# Patient Record
Sex: Female | Born: 1970 | Race: White | Hispanic: No | Marital: Single | Smoking: Current some day smoker
Health system: Southern US, Community
[De-identification: ages and names within clinical notes are randomized; demographics above are authoritative.]

---

## 2018-12-20 ENCOUNTER — Encounter: Payer: Self-pay | Admitting: Sports Medicine

## 2018-12-20 ENCOUNTER — Ambulatory Visit (INDEPENDENT_AMBULATORY_CARE_PROVIDER_SITE_OTHER): Payer: Self-pay | Admitting: Sports Medicine

## 2018-12-20 ENCOUNTER — Other Ambulatory Visit: Payer: Self-pay | Admitting: Sports Medicine

## 2018-12-20 ENCOUNTER — Other Ambulatory Visit: Payer: Self-pay

## 2018-12-20 VITALS — Temp 98.9°F | Resp 16

## 2018-12-20 DIAGNOSIS — M722 Plantar fascial fibromatosis: Secondary | ICD-10-CM

## 2018-12-20 DIAGNOSIS — S46919A Strain of unspecified muscle, fascia and tendon at shoulder and upper arm level, unspecified arm, initial encounter: Secondary | ICD-10-CM | POA: Insufficient documentation

## 2018-12-20 DIAGNOSIS — K029 Dental caries, unspecified: Secondary | ICD-10-CM | POA: Insufficient documentation

## 2018-12-20 DIAGNOSIS — M79672 Pain in left foot: Secondary | ICD-10-CM

## 2018-12-20 DIAGNOSIS — K047 Periapical abscess without sinus: Secondary | ICD-10-CM | POA: Insufficient documentation

## 2018-12-20 MED ORDER — PREDNISONE 10 MG (21) PO TBPK: ORAL_TABLET | ORAL | 0 refills | Status: AC

## 2018-12-20 MED ORDER — TRIAMCINOLONE ACETONIDE 10 MG/ML IJ SUSP
10.0000 mg | Freq: Once | INTRAMUSCULAR | Status: AC
  Administered 2018-12-20: 22:00:00 10 mg

## 2018-12-20 NOTE — Patient Instructions (Signed)
Plantar Fasciitis  Plantar fasciitis is a painful foot condition that affects the heel. It occurs when the band of tissue that connects the toes to the heel bone (plantar fascia) becomes irritated. This can happen as the result of exercising too much or doing other repetitive activities (overuse injury). The pain from plantar fasciitis can range from mild irritation to severe pain that makes it difficult to walk or move. The pain is usually worse in the morning after sleeping, or after sitting or lying down for a while. Pain may also be worse after long periods of walking or standing. What are the causes? This condition may be caused by:  Standing for long periods of time.  Wearing shoes that do not have good arch support.  Doing activities that put stress on joints (high-impact activities), including running, aerobics, and ballet.  Being overweight.  An abnormal way of walking (gait).  Tight muscles in the back of your lower leg (calf).  High arches in your feet.  Starting a new athletic activity. What are the signs or symptoms? The main symptom of this condition is heel pain. Pain may:  Be worse with first steps after a time of rest, especially in the morning after sleeping or after you have been sitting or lying down for a while.  Be worse after long periods of standing still.  Decrease after 30-45 minutes of activity, such as gentle walking. How is this diagnosed? This condition may be diagnosed based on your medical history and your symptoms. Your health care provider may ask questions about your activity level. Your health care provider will do a physical exam to check for:  A tender area on the bottom of your foot.  A high arch in your foot.  Pain when you move your foot.  Difficulty moving your foot. You may have imaging tests to confirm the diagnosis, such as:  X-rays.  Ultrasound.  MRI. How is this treated? Treatment for plantar fasciitis depends on how  severe your condition is. Treatment may include:  Rest, ice, applying pressure (compression), and raising the affected foot (elevation). This may be called RICE therapy. Your health care provider may recommend RICE therapy along with over-the-counter pain medicines to manage your pain.  Exercises to stretch your calves and your plantar fascia.  A splint that holds your foot in a stretched, upward position while you sleep (night splint).  Physical therapy to relieve symptoms and prevent problems in the future.  Injections of steroid medicine (cortisone) to relieve pain and inflammation.  Stimulating your plantar fascia with electrical impulses (extracorporeal shock wave therapy). This is usually the last treatment option before surgery.  Surgery, if other treatments have not worked after 12 months. Follow these instructions at home:  Managing pain, stiffness, and swelling  If directed, put ice on the painful area: ? Put ice in a plastic bag, or use a frozen bottle of water. ? Place a towel between your skin and the bag or bottle. ? Roll the bottom of your foot over the bag or bottle. ? Do this for 20 minutes, 2-3 times a day.  Wear athletic shoes that have air-sole or gel-sole cushions, or try wearing soft shoe inserts that are designed for plantar fasciitis.  Raise (elevate) your foot above the level of your heart while you are sitting or lying down. Activity  Avoid activities that cause pain. Ask your health care provider what activities are safe for you.  Do physical therapy exercises and stretches as told   by your health care provider.  Try activities and forms of exercise that are easier on your joints (low-impact). Examples include swimming, water aerobics, and biking. General instructions  Take over-the-counter and prescription medicines only as told by your health care provider.  Wear a night splint while sleeping, if told by your health care provider. Loosen the splint  if your toes tingle, become numb, or turn cold and blue.  Maintain a healthy weight, or work with your health care provider to lose weight as needed.  Keep all follow-up visits as told by your health care provider. This is important. Contact a health care provider if you:  Have symptoms that do not go away after caring for yourself at home.  Have pain that gets worse.  Have pain that affects your ability to move or do your daily activities. Summary  Plantar fasciitis is a painful foot condition that affects the heel. It occurs when the band of tissue that connects the toes to the heel bone (plantar fascia) becomes irritated.  The main symptom of this condition is heel pain that may be worse after exercising too much or standing still for a long time.  Treatment varies, but it usually starts with rest, ice, compression, and elevation (RICE therapy) and over-the-counter medicines to manage pain. This information is not intended to replace advice given to you by your health care provider. Make sure you discuss any questions you have with your health care provider. Document Released: 01/03/2001 Document Revised: 03/23/2017 Document Reviewed: 02/05/2017 [employer] Patient Education  2020 [employer]  

## 2018-12-20 NOTE — Progress Notes (Signed)
Subjective: Tina Murphy is a 48 y.o. female patient presents to office with complaint of moderate heel pain on the left. Patient admits to post static dyskinesia for 1 week in duration, 9/10pain that is constant after working 12 hours feel like her arch is trying to stretch open, Admits swelling but denies opening, drainage, redness, or warmth. Patient has treated this problem with diclofenac as given by her PCP but could not take it because it made her stomach hurt. Denies any other pedal complaints.   Review of Systems  Musculoskeletal: Positive for joint pain and myalgias.  All other systems reviewed and are negative.    Patient Active Problem List   Diagnosis Date Noted  . Dental abscess 12/20/2018  . Dental caries 12/20/2018  . Plantar fasciitis of left foot 12/20/2018  . Shoulder strain 12/20/2018    No current outpatient medications on file prior to visit.   No current facility-administered medications on file prior to visit.     Not on File  Objective: Physical Exam General: The patient is alert and oriented x3 in no acute distress.  Dermatology: Skin is warm, dry and supple bilateral lower extremities. Nails 1-10 are normal. There is no erythema, edema, no eccymosis, no open lesions present. Integument is otherwise unremarkable.  Vascular: Dorsalis Pedis pulse and Posterior Tibial pulse are 2/4 bilateral. Capillary fill time is immediate to all digits.  Neurological: Grossly intact to light touch with an achilles reflex of +2/5 and a  negative Tinel's sign bilateral.  Musculoskeletal: Tenderness to palpation at the medial calcaneal tubercale and through the insertion of the plantar fascia on the left foot. No pain with compression of calcaneus bilateral. No pain with tuning fork to calcaneus bilateral. No pain with calf compression bilateral. There is decreased Ankle joint range of motion bilateral. All other joints range of motion within normal limits bilateral.  Strength 5/5 in all groups bilateral.   Gait: Unassisted, Antalgic avoid weight on left heel  Xray, Left foot:  Normal osseous mineralization. Joint spaces preserved. No fracture/dislocation/boney destruction. Calcaneal spur present with mild thickening of plantar fascia. No other soft tissue abnormalities or radiopaque foreign bodies.   Assessment and Plan: Problem List Items Addressed This Visit      Musculoskeletal and Integument   Plantar fasciitis of left foot - Primary    Other Visit Diagnoses    Inflammatory pain of left heel         -Complete examination performed.  -Xrays reviewed -Discussed with patient in detail the condition of plantar fasciitis, how this occurs and general treatment options. Explained both conservative and surgical treatments.  -After oral consent and aseptic prep, injected a mixture containing 1 ml of 2%  plain lidocaine, 1 ml 0.5% plain marcaine, 0.5 ml of kenalog 10 and 0.5 ml of dexamethasone phosphate into left heel. Post-injection care discussed with patient.  -Dispensed heel lift to use as instructed -Explained and dispensed to patient daily stretching exercises. -Recommend patient to ice affected area 1-2x daily. -Patient to return to office as needed or sooner if problems or questions arise.  Landis Martins, DPM

## 2021-05-23 ENCOUNTER — Other Ambulatory Visit: Payer: Self-pay | Admitting: Family Medicine

## 2021-05-23 DIAGNOSIS — M2740 Unspecified cyst of jaw: Secondary | ICD-10-CM

## 2021-06-10 ENCOUNTER — Other Ambulatory Visit: Payer: Self-pay

## 2021-06-17 ENCOUNTER — Other Ambulatory Visit: Payer: PRIVATE HEALTH INSURANCE

## 2021-08-02 ENCOUNTER — Other Ambulatory Visit: Payer: PRIVATE HEALTH INSURANCE

## 2021-08-05 ENCOUNTER — Ambulatory Visit (INDEPENDENT_AMBULATORY_CARE_PROVIDER_SITE_OTHER): Payer: 59

## 2021-08-05 DIAGNOSIS — M2740 Unspecified cyst of jaw: Secondary | ICD-10-CM

## 2021-08-15 ENCOUNTER — Other Ambulatory Visit: Payer: Self-pay | Admitting: Family Medicine

## 2021-08-15 DIAGNOSIS — Z1231 Encounter for screening mammogram for malignant neoplasm of breast: Secondary | ICD-10-CM

## 2021-12-06 ENCOUNTER — Other Ambulatory Visit: Payer: Self-pay | Admitting: Unknown Physician Specialty

## 2021-12-06 DIAGNOSIS — K118 Other diseases of salivary glands: Secondary | ICD-10-CM

## 2021-12-08 NOTE — Progress Notes (Signed)
Patient on schedule for non sedated Parotid biopsy, called and spoke with patient on phone. Made aware to be here @ 1230, and may drive home post procedure. Stated understanding.

## 2021-12-14 ENCOUNTER — Ambulatory Visit
Admission: RE | Admit: 2021-12-14 | Discharge: 2021-12-14 | Disposition: A | Discharge status: Home / Self Care | Payer: Commercial Managed Care - HMO | Source: Ambulatory Visit | Attending: Unknown Physician Specialty | Admitting: Unknown Physician Specialty

## 2021-12-14 DIAGNOSIS — K118 Other diseases of salivary glands: Secondary | ICD-10-CM

## 2021-12-14 DIAGNOSIS — D110 Benign neoplasm of parotid gland: Secondary | ICD-10-CM | POA: Diagnosis not present

## 2021-12-15 LAB — SURGICAL PATHOLOGY

## 2021-12-23 ENCOUNTER — Other Ambulatory Visit: Payer: Self-pay | Admitting: Unknown Physician Specialty

## 2021-12-23 DIAGNOSIS — D37030 Neoplasm of uncertain behavior of the parotid salivary glands: Secondary | ICD-10-CM

## 2022-01-05 ENCOUNTER — Inpatient Hospital Stay: Admission: RE | Admit: 2022-01-05 | Payer: 59 | Source: Ambulatory Visit

## 2022-01-17 ENCOUNTER — Inpatient Hospital Stay: Admission: RE | Admit: 2022-01-17 | Payer: 59 | Source: Ambulatory Visit

## 2022-02-02 ENCOUNTER — Inpatient Hospital Stay: Admission: RE | Admit: 2022-02-02 | Payer: 59 | Source: Ambulatory Visit

## 2022-02-09 ENCOUNTER — Ambulatory Visit
Admission: RE | Admit: 2022-02-09 | Discharge: 2022-02-09 | Disposition: A | Discharge status: Home / Self Care | Payer: Commercial Managed Care - HMO | Source: Ambulatory Visit | Attending: Unknown Physician Specialty | Admitting: Unknown Physician Specialty

## 2022-02-09 DIAGNOSIS — D37030 Neoplasm of uncertain behavior of the parotid salivary glands: Secondary | ICD-10-CM

## 2022-02-09 MED ORDER — IOPAMIDOL (ISOVUE-300) INJECTION 61%
75.0000 mL | Freq: Once | INTRAVENOUS | Status: AC | PRN
  Administered 2022-02-09: 75 mL via INTRAVENOUS

## 2023-07-25 IMAGING — CT CT MAXILLOFACIAL W/O CM
3 series · 15 of 47 positions shown, 18 images · non-contrast
Comparison: None.

CLINICAL DATA: Cyst like area anterior to the left ear.



[Series 2: max soft · axial · 0.38mm/px · z∈[-186,-72]mm · 9 of 67 slices shown, 12 images]
[im 5/67  brain]
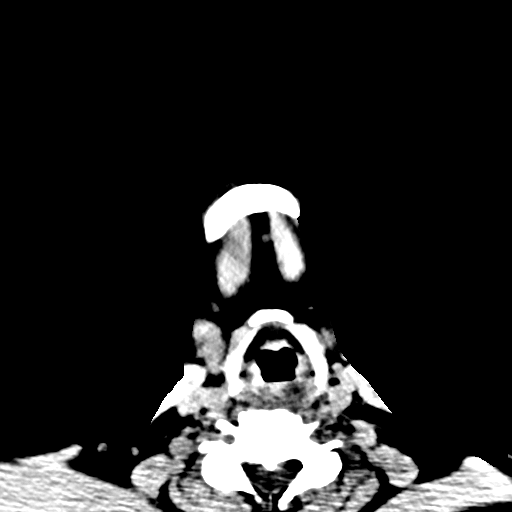
[im 5/67  bone]
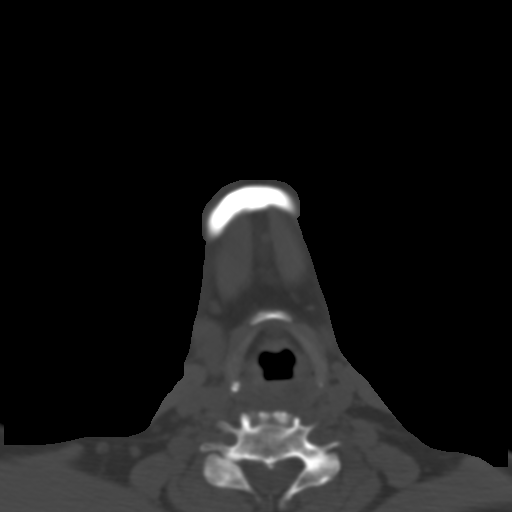
[im 12/67  bone]
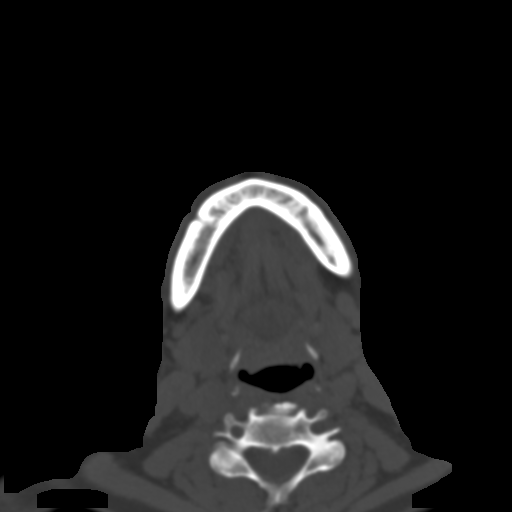
[im 19/67  bone]
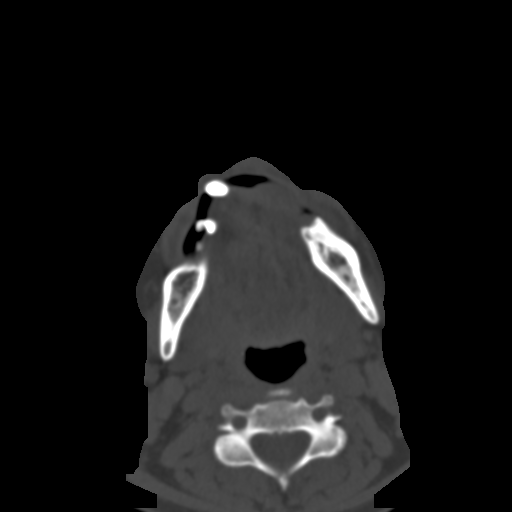
[im 26/67  bone]
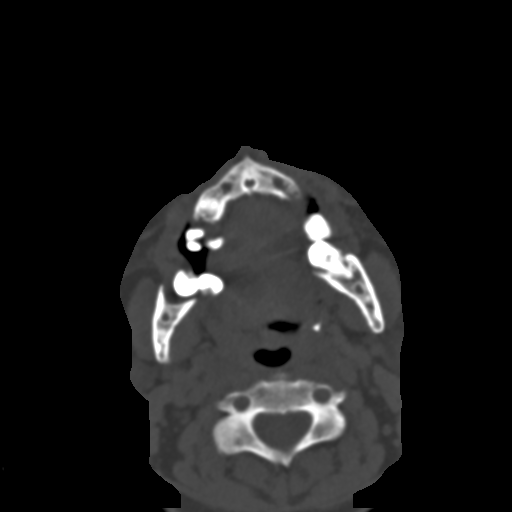
[im 35/67  brain]
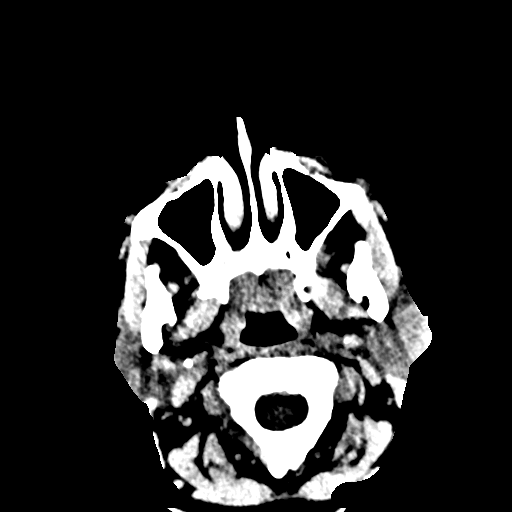
[im 35/67  bone]
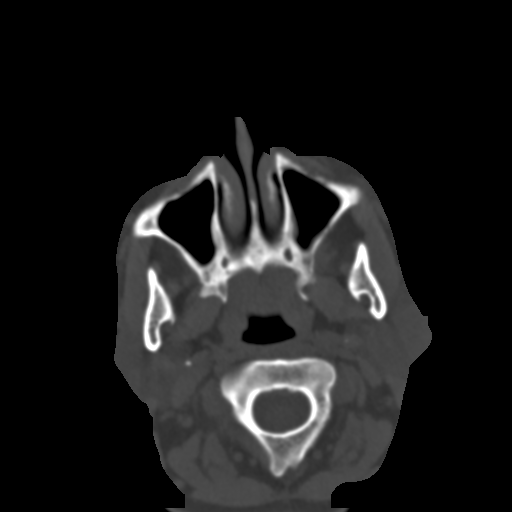
[im 41/67  bone]
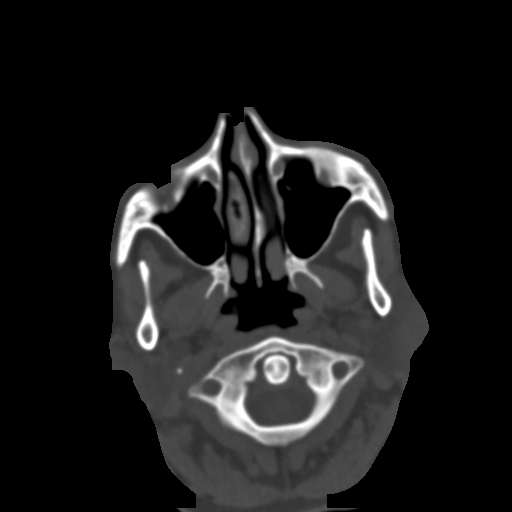
[im 48/67  bone]
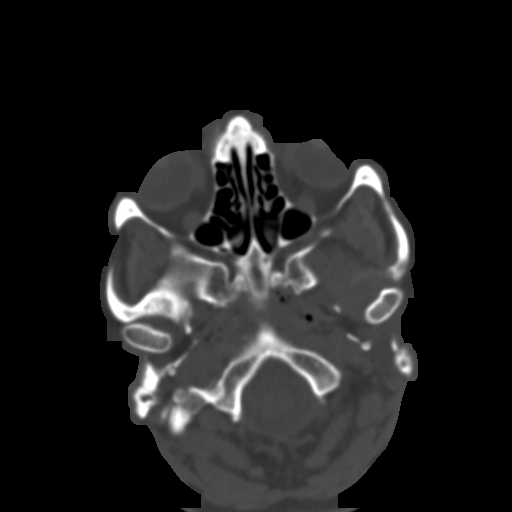
[im 55/67  bone]
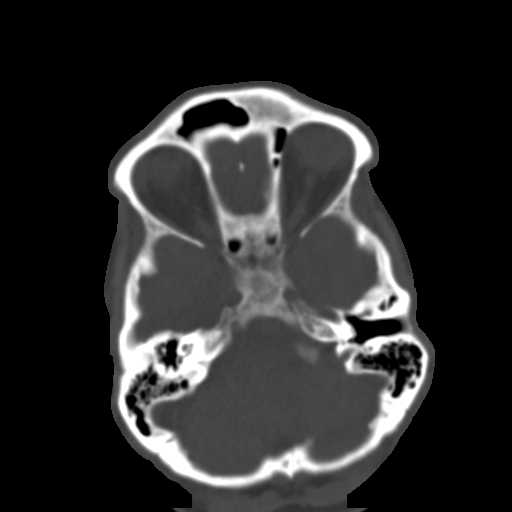
[im 62/67  brain]
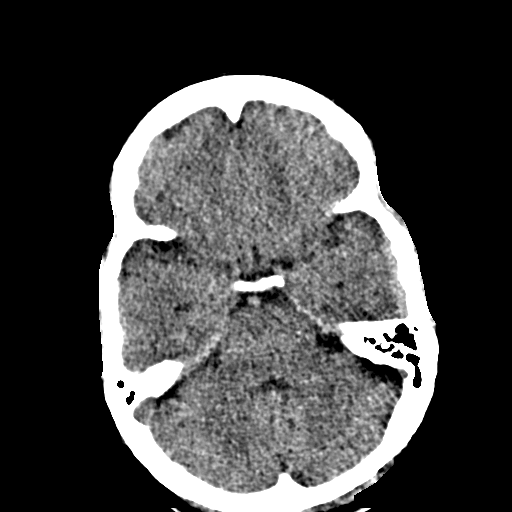
[im 62/67  bone]
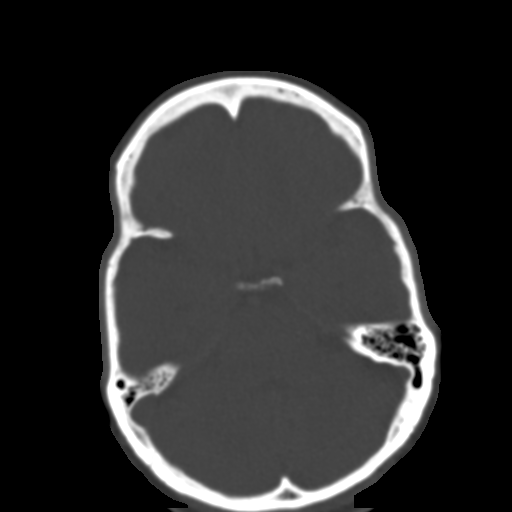

[Series 6: coronal soft · coronal · 0.36mm/px · 3 of 73 slices shown]
[im 25/73  bone]
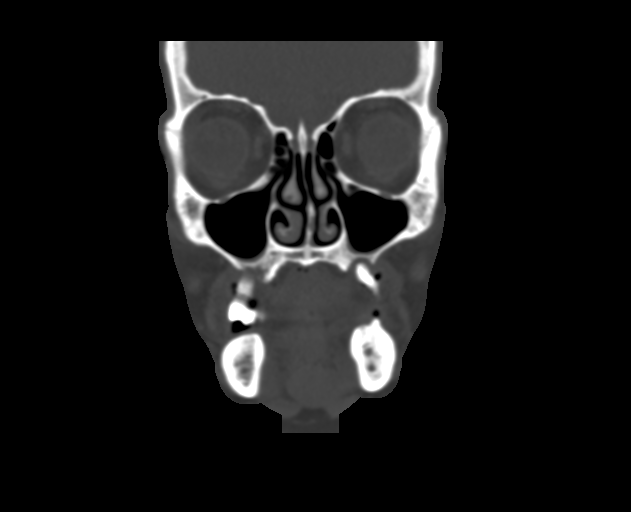
[im 33/73  bone]
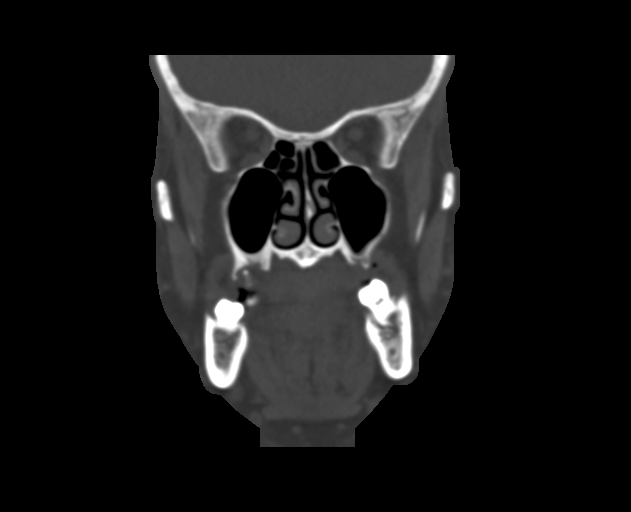
[im 41/73  bone]
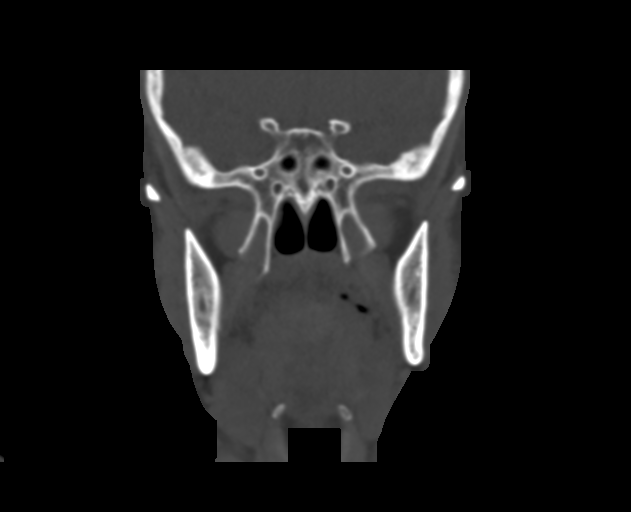

[Series 8: sagittal soft · sagittal · 0.34mm/px · 3 of 72 slices shown]
[im 24/72  bone]
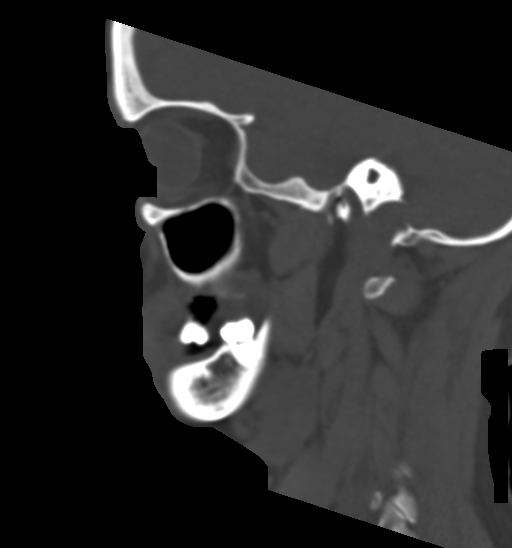
[im 36/72  bone]
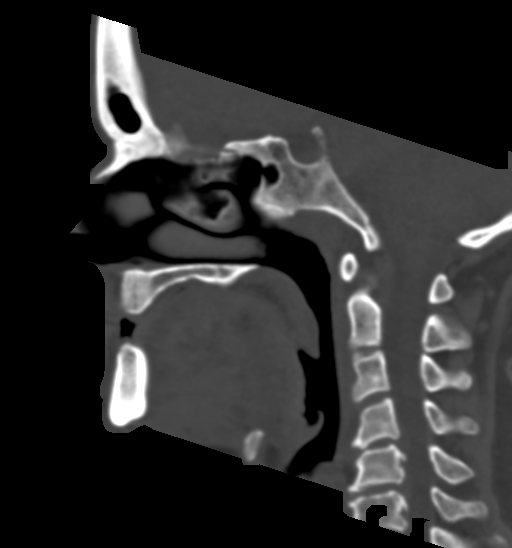
[im 48/72  bone]
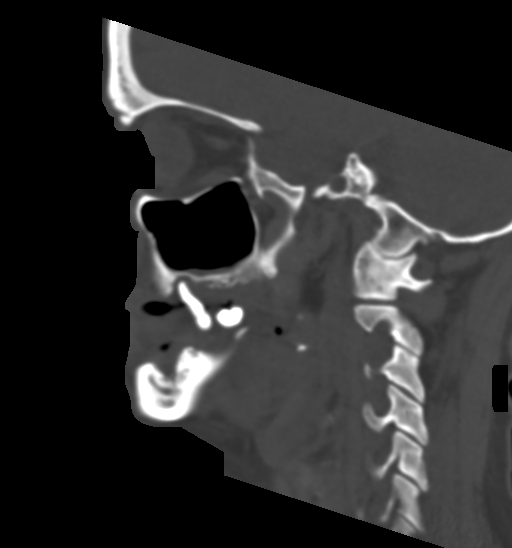

[15 of 47 positions shown; findings below may reference images not displayed]

FINDINGS: Osseous: No fracture or mandibular dislocation. No destructive
process.

Orbits: The globes and orbits are within normal limits.

Sinuses: The paranasal sinuses and mastoid air cells are clear.

Soft tissues: A well-defined hyperdense lesion is present in the
lateral aspect of the left parotid gland measuring 2.8 x 2.3 x
cm. This corresponds to the area marked. Right parotid gland is
within normal limits. Submandibular glands and ducts are normal.
Calcifications are present in the left palatine tonsil. Soft tissues
are otherwise unremarkable.

Limited intracranial: Within normal limits.
IMPRESSION: 2.8 x 2.3 x 2.9 cm well-defined hyperdense lesion in the lateral
aspect of the left parotid gland corresponds to the area marked.
This likely represents a benign lesion such as a pleomorphic adenoma
or Warthin's tumor.
